# Patient Record
Sex: Female | Born: 1995 | Race: White | Hispanic: No | Marital: Single | Smoking: Never smoker
Health system: Southern US, Community
[De-identification: ages and names within clinical notes are randomized; demographics above are authoritative.]

## PROBLEM LIST (undated history)

## (undated) ENCOUNTER — Inpatient Hospital Stay (HOSPITAL_COMMUNITY): Payer: Self-pay

## (undated) DIAGNOSIS — R569 Unspecified convulsions: Secondary | ICD-10-CM

## (undated) DIAGNOSIS — D649 Anemia, unspecified: Secondary | ICD-10-CM

## (undated) HISTORY — PX: NO PAST SURGERIES: SHX2092

---

## 2017-11-03 LAB — OB RESULTS CONSOLE HGB/HCT, BLOOD
HEMATOCRIT: 38
Hemoglobin: 12.5

## 2017-11-03 LAB — OB RESULTS CONSOLE ABO/RH: RH TYPE: POSITIVE

## 2017-11-03 LAB — OB RESULTS CONSOLE RPR: RPR: NONREACTIVE

## 2017-11-03 LAB — OB RESULTS CONSOLE GC/CHLAMYDIA
Chlamydia: NEGATIVE
GC PROBE AMP, GENITAL: NEGATIVE

## 2017-11-03 LAB — OB RESULTS CONSOLE PLATELET COUNT: PLATELETS: 270

## 2017-11-03 LAB — OB RESULTS CONSOLE RUBELLA ANTIBODY, IGM: RUBELLA: IMMUNE

## 2017-11-03 LAB — OB RESULTS CONSOLE ANTIBODY SCREEN: ANTIBODY SCREEN: NEGATIVE

## 2017-11-03 LAB — OB RESULTS CONSOLE HIV ANTIBODY (ROUTINE TESTING): HIV: NONREACTIVE

## 2017-11-03 LAB — OB RESULTS CONSOLE HEPATITIS B SURFACE ANTIGEN: Hepatitis B Surface Ag: NEGATIVE

## 2017-11-12 ENCOUNTER — Other Ambulatory Visit (HOSPITAL_COMMUNITY): Payer: Self-pay | Admitting: Specialist

## 2017-11-12 DIAGNOSIS — O0930 Supervision of pregnancy with insufficient antenatal care, unspecified trimester: Secondary | ICD-10-CM

## 2017-11-12 DIAGNOSIS — Z3A23 23 weeks gestation of pregnancy: Secondary | ICD-10-CM

## 2017-11-12 DIAGNOSIS — Z3689 Encounter for other specified antenatal screening: Secondary | ICD-10-CM

## 2017-11-21 ENCOUNTER — Encounter (HOSPITAL_COMMUNITY): Payer: Self-pay | Admitting: Specialist

## 2017-11-26 ENCOUNTER — Encounter (HOSPITAL_COMMUNITY): Payer: Self-pay | Admitting: *Deleted

## 2017-11-27 ENCOUNTER — Ambulatory Visit (HOSPITAL_COMMUNITY): Admission: RE | Admit: 2017-11-27 | Payer: Medicaid Other | Source: Ambulatory Visit

## 2017-11-27 ENCOUNTER — Other Ambulatory Visit: Payer: Self-pay

## 2017-11-27 ENCOUNTER — Observation Stay (HOSPITAL_COMMUNITY)
Admission: AD | Admit: 2017-11-27 | Discharge: 2017-11-28 | Disposition: A | Discharge status: Home / Self Care | Payer: Medicaid Other | Source: Ambulatory Visit | Attending: Obstetrics & Gynecology | Admitting: Obstetrics & Gynecology

## 2017-11-27 ENCOUNTER — Ambulatory Visit (HOSPITAL_COMMUNITY)
Admission: RE | Admit: 2017-11-27 | Discharge: 2017-11-27 | Disposition: A | Discharge status: Home / Self Care | Payer: Medicaid Other | Source: Ambulatory Visit | Attending: Specialist | Admitting: Specialist

## 2017-11-27 ENCOUNTER — Encounter (HOSPITAL_COMMUNITY): Payer: Self-pay | Admitting: *Deleted

## 2017-11-27 ENCOUNTER — Encounter (HOSPITAL_COMMUNITY): Payer: Self-pay

## 2017-11-27 DIAGNOSIS — O0992 Supervision of high risk pregnancy, unspecified, second trimester: Secondary | ICD-10-CM | POA: Insufficient documentation

## 2017-11-27 DIAGNOSIS — E669 Obesity, unspecified: Secondary | ICD-10-CM | POA: Insufficient documentation

## 2017-11-27 DIAGNOSIS — O4100X0 Oligohydramnios, unspecified trimester, not applicable or unspecified: Secondary | ICD-10-CM

## 2017-11-27 DIAGNOSIS — Z3A23 23 weeks gestation of pregnancy: Secondary | ICD-10-CM | POA: Insufficient documentation

## 2017-11-27 DIAGNOSIS — O99210 Obesity complicating pregnancy, unspecified trimester: Secondary | ICD-10-CM | POA: Diagnosis present

## 2017-11-27 DIAGNOSIS — Z3687 Encounter for antenatal screening for uncertain dates: Secondary | ICD-10-CM | POA: Insufficient documentation

## 2017-11-27 DIAGNOSIS — O321XX0 Maternal care for breech presentation, not applicable or unspecified: Secondary | ICD-10-CM | POA: Diagnosis present

## 2017-11-27 DIAGNOSIS — O99352 Diseases of the nervous system complicating pregnancy, second trimester: Secondary | ICD-10-CM | POA: Insufficient documentation

## 2017-11-27 DIAGNOSIS — O4103X0 Oligohydramnios, third trimester, not applicable or unspecified: Secondary | ICD-10-CM | POA: Diagnosis not present

## 2017-11-27 DIAGNOSIS — O99212 Obesity complicating pregnancy, second trimester: Secondary | ICD-10-CM

## 2017-11-27 DIAGNOSIS — O4102X1 Oligohydramnios, second trimester, fetus 1: Principal | ICD-10-CM | POA: Insufficient documentation

## 2017-11-27 DIAGNOSIS — O0930 Supervision of pregnancy with insufficient antenatal care, unspecified trimester: Secondary | ICD-10-CM

## 2017-11-27 DIAGNOSIS — O4102X0 Oligohydramnios, second trimester, not applicable or unspecified: Secondary | ICD-10-CM | POA: Insufficient documentation

## 2017-11-27 DIAGNOSIS — O0990 Supervision of high risk pregnancy, unspecified, unspecified trimester: Secondary | ICD-10-CM

## 2017-11-27 DIAGNOSIS — O209 Hemorrhage in early pregnancy, unspecified: Secondary | ICD-10-CM | POA: Diagnosis not present

## 2017-11-27 DIAGNOSIS — Z3689 Encounter for other specified antenatal screening: Secondary | ICD-10-CM | POA: Insufficient documentation

## 2017-11-27 DIAGNOSIS — Z3A24 24 weeks gestation of pregnancy: Secondary | ICD-10-CM | POA: Insufficient documentation

## 2017-11-27 DIAGNOSIS — O321XX1 Maternal care for breech presentation, fetus 1: Secondary | ICD-10-CM | POA: Insufficient documentation

## 2017-11-27 DIAGNOSIS — O0932 Supervision of pregnancy with insufficient antenatal care, second trimester: Secondary | ICD-10-CM

## 2017-11-27 DIAGNOSIS — Z79899 Other long term (current) drug therapy: Secondary | ICD-10-CM | POA: Insufficient documentation

## 2017-11-27 DIAGNOSIS — G40909 Epilepsy, unspecified, not intractable, without status epilepticus: Secondary | ICD-10-CM | POA: Insufficient documentation

## 2017-11-27 DIAGNOSIS — O289 Unspecified abnormal findings on antenatal screening of mother: Secondary | ICD-10-CM | POA: Diagnosis not present

## 2017-11-27 DIAGNOSIS — O280 Abnormal hematological finding on antenatal screening of mother: Secondary | ICD-10-CM | POA: Diagnosis present

## 2017-11-27 HISTORY — DX: Unspecified convulsions: R56.9

## 2017-11-27 HISTORY — DX: Anemia, unspecified: D64.9

## 2017-11-27 LAB — CBC
HCT: 38.8 % (ref 36.0–46.0)
Hemoglobin: 13 g/dL (ref 12.0–15.0)
MCH: 28.2 pg (ref 26.0–34.0)
MCHC: 33.5 g/dL (ref 30.0–36.0)
MCV: 84.2 fL (ref 78.0–100.0)
PLATELETS: 235 10*3/uL (ref 150–400)
RBC: 4.61 MIL/uL (ref 3.87–5.11)
RDW: 16.1 % — AB (ref 11.5–15.5)
WBC: 11.8 10*3/uL — AB (ref 4.0–10.5)

## 2017-11-27 LAB — COMPREHENSIVE METABOLIC PANEL
ALK PHOS: 61 U/L (ref 38–126)
ALT: 15 U/L (ref 14–54)
AST: 18 U/L (ref 15–41)
Albumin: 3.2 g/dL — ABNORMAL LOW (ref 3.5–5.0)
Anion gap: 8 (ref 5–15)
BILIRUBIN TOTAL: 0.3 mg/dL (ref 0.3–1.2)
BUN: 11 mg/dL (ref 6–20)
CALCIUM: 8.6 mg/dL — AB (ref 8.9–10.3)
CO2: 21 mmol/L — ABNORMAL LOW (ref 22–32)
CREATININE: 0.57 mg/dL (ref 0.44–1.00)
Chloride: 105 mmol/L (ref 101–111)
GFR calc Af Amer: >60 mL/min (ref >60)
Glucose, Bld: 81 mg/dL (ref 65–99)
POTASSIUM: 4 mmol/L (ref 3.5–5.1)
Sodium: 134 mmol/L — ABNORMAL LOW (ref 135–145)
TOTAL PROTEIN: 6.7 g/dL (ref 6.5–8.1)

## 2017-11-27 LAB — URINALYSIS, ROUTINE W REFLEX MICROSCOPIC
BILIRUBIN URINE: NEGATIVE
Bilirubin Urine: NEGATIVE
GLUCOSE, UA: NEGATIVE mg/dL
GLUCOSE, UA: NEGATIVE mg/dL
HGB URINE DIPSTICK: NEGATIVE
KETONES UR: NEGATIVE mg/dL
Ketones, ur: NEGATIVE mg/dL
LEUKOCYTES UA: NEGATIVE
NITRITE: NEGATIVE
Nitrite: NEGATIVE
PH: 8 (ref 5.0–8.0)
PH: 8 (ref 5.0–8.0)
PROTEIN: NEGATIVE mg/dL
Protein, ur: NEGATIVE mg/dL
SPECIFIC GRAVITY, URINE: 1.011 (ref 1.005–1.030)
SPECIFIC GRAVITY, URINE: 1.012 (ref 1.005–1.030)

## 2017-11-27 LAB — ABO/RH: ABO/RH(D): A POS

## 2017-11-27 LAB — RAPID URINE DRUG SCREEN, HOSP PERFORMED
Amphetamines: NOT DETECTED
Barbiturates: NOT DETECTED
Benzodiazepines: NOT DETECTED
Cocaine: NOT DETECTED
Opiates: NOT DETECTED
TETRAHYDROCANNABINOL: POSITIVE — AB

## 2017-11-27 LAB — PROTEIN / CREATININE RATIO, URINE
CREATININE, URINE: 70 mg/dL
PROTEIN CREATININE RATIO: 0.1 mg/mg{creat} (ref 0.00–0.15)
Total Protein, Urine: 7 mg/dL

## 2017-11-27 LAB — TYPE AND SCREEN
ABO/RH(D): A POS
Antibody Screen: NEGATIVE

## 2017-11-27 LAB — AMNISURE RUPTURE OF MEMBRANE (ROM) NOT AT ARMC: Amnisure ROM: NEGATIVE

## 2017-11-27 MED ORDER — PRENATAL MULTIVITAMIN CH
1.0000 | ORAL_TABLET | Freq: Every day | ORAL | Status: DC
  Administered 2017-11-27: 1 via ORAL
  Filled 2017-11-27: qty 1

## 2017-11-27 MED ORDER — ACETAMINOPHEN 325 MG PO TABS: 650.0000 mg | ORAL_TABLET | ORAL | Status: DC | PRN

## 2017-11-27 MED ORDER — DOCUSATE SODIUM 100 MG PO CAPS
100.0000 mg | ORAL_CAPSULE | Freq: Every day | ORAL | Status: DC
  Administered 2017-11-27: 100 mg via ORAL
  Filled 2017-11-27: qty 1

## 2017-11-27 MED ORDER — CALCIUM CARBONATE ANTACID 500 MG PO CHEW: 2.0000 | CHEWABLE_TABLET | ORAL | Status: DC | PRN

## 2017-11-27 MED ORDER — BETAMETHASONE SOD PHOS & ACET 6 (3-3) MG/ML IJ SUSP
12.0000 mg | INTRAMUSCULAR | Status: AC
  Administered 2017-11-27 – 2017-11-28 (×2): 12 mg via INTRAMUSCULAR
  Filled 2017-11-27 (×3): qty 2

## 2017-11-27 MED ORDER — ZOLPIDEM TARTRATE 5 MG PO TABS: 5.0000 mg | ORAL_TABLET | Freq: Every evening | ORAL | Status: DC | PRN

## 2017-11-27 NOTE — Consult Note (Signed)
Neonatology Consult Note:  At the request of the patients obstetrician Dr. Roselie Awkward I met with Annette Bridges who is a 22 y.o. G1P0,at [redacted]w[redacted]d  FOB, PGM and aunt were also present for the consult.   Pregnancy complicated by oligohydramnios, an abnormal Quad screen with 1:5 risk of ONTD. Both MSAFP and Inhibin are significantly elevated On ultrasound evaluation there is no evidence of ONTD and therefore the elevation in serum analytes is likely due to poor placentation rather than an ONTD.  One kidney was clearly visualized suggesting that renal agenesis is not the cause of the oligohydramnios and  Ms. BChisumdoes not endorse a history of fluid loss.   She will be evaluated for PPROM and if positive will start on latency antibiotics. She is receiving betamethasone injections and will observed on the antenatal unit.  We discussed morbidity/mortality at this gestional age, delivery room resuscitation, including intubation and surfactant in DR.  Discussed mechanical ventilation and risk for chronic lung disease, risk for IVH with potential for motor / cognitive deficits, ROP, NEC, sepsis, as well as temperature instability and feeding immaturity.  Discussed NG / OG feeds, benefits of MBM in reducing incidence of NEC.   Discussed likely length of stay.  Both [name] and FOB requested full resuscitative efforts for their infant.  Thank you for allowing [facility] participate in her care.  Please call with questions.  BHiginio Roger DO  Neonatologist  The total length of face-to-face or floor / unit time for this encounter was 30 minutes.  Counseling and / or coordination of care was greater than fifty percent of the time.

## 2017-11-27 NOTE — ED Notes (Signed)
Report called to Casimiro NeedlePam Lawson, RN.  Pt ambulated with Mother to room 311 per [facility]Nitsche[facility]

## 2017-11-27 NOTE — Consult Note (Signed)
MATERNAL FETAL MEDICINE CONSULT  Patient Name: Annette Bridges Medical Record Number:  [id number] Date of Birth: Feb 11, 1996 Requesting Physician Name:  Alm Bustard, MD Date of Service: 11/27/2017  Chief Complaint Elevated ONTD risk  History of Present Illness Annette Bridges  is a 22 y.o. G1P0,at [redacted]w[redacted]d with an EDD of 03/20/2018 who is seen at the request of Dr. Shawnie Pons for 1:5 risk of ONTD on her second trimester screen.  Both MSAFP and Inhibin are significantly elevated.  She reports some mild vaginal bleeding after intercourse 2 days ago, but it has resolved.  She denies loss of fluid or contractions.  She has a seizure for which she takes lamotrigine.  She is otherwise healthy.  Review of Systems Pertinent items are noted in HPI.  Patient History OB History  Gravida Para Term Preterm AB Living  1            SAB TAB Ectopic Multiple Live Births               # Outcome Date GA Lbr Len/2nd Weight Sex Delivery Anes PTL Lv  1 Current             Past Medical History:  Diagnosis Date  . Anemia   . Seizures (HCC)     Past Surgical History:  Procedure Laterality Date  . NO PAST SURGERIES      Social History   Socioeconomic History  . Marital status: Single    Spouse name: Not on file  . Number of children: Not on file  . Years of education: Not on file  . Highest education level: Not on file  Occupational History  . Not on file  Social Needs  . Financial resource strain: Not on file  . Food insecurity:    Worry: Not on file    Inability: Not on file  . Transportation needs:    Medical: Not on file    Non-medical: Not on file  Tobacco Use  . Smoking status: Never Smoker  . Smokeless tobacco: Never Used  Substance and Sexual Activity  . Alcohol use: Never    Frequency: Never  . Drug use: Yes    Types: Marijuana  . Sexual activity: Not on file  Lifestyle  . Physical activity:    Days per week: Not on file    Minutes per session: Not on file  . Stress: Not on file   Relationships  . Social connections:    Talks on phone: Not on file    Gets together: Not on file    Attends religious service: Not on file    Active member of club or organization: Not on file    Attends meetings of clubs or organizations: Not on file    Relationship status: Not on file  Other Topics Concern  . Not on file  Social History Narrative  . Not on file    Physical Examination Vitals - BP 146/92, Pulse 80, Weight 222 lbs. General appearance - alert, well appearing, and in no distress Mental status - alert, oriented to person, place, and time Abdomen - soft, nontender, nondistended, no masses or organomegaly Extremities - no pedal edema noted  Assessment and Recommendations 1.  Elevated MSAFP and Inhibin.  Severe oligohydramnios was discovered on today's ultrasound.  Although the visualization of the anatomy is limit there is no evidence of ONTD either in the spine or intracranially.  Thus, I am concerned that the elevation in serum analytes is due to poor placentation  rather than an ONTD.  One kidney was clearly visualized today suggesting that renal agenesis is not the cause of the oligohydramnios.  In addition, Ms. Athena MasseBonney reports her fluid level was normal on her last ultrasound which was performed on 11/03/17.  Given all these points I think the primary problem is placental insufficiency which would explain both the oligohydramnios and elevated MSAFP and Inhibin.  Severe oligohydramnios at this early point in pregnancy greatly increases Ms. Shuler's risk of premature birth and stillbirth, although there does not appear to be any acute fetal compromise at this time.  As Ms. Athena MasseBonney is past viability (I.e. EFW 540 g, GA 625w6d) I recommend admission for betamethasone, NICU consult, and extended fetal monitoring.  Ms. Athena MasseBonney agrees.  I have discussed her case with Dr. Debroah LoopArnold with the faculty practice, and she will go directly to antepartum from here.  I spent 30 minutes with Ms.  Athena MasseBonney today of which 50% was face-to-face counseling.  [facility].  Please do not hesitate to contact us with questions.   Rema FendtNITSCHE,Ramiel Forti, MD

## 2017-11-27 NOTE — H&P (Signed)
Faculty Practice Antenatal History and Physical  Elysia Grand WJX:[id number] DOB: 1996/01/28 DOA: 11/27/2017  Chief Complaint: low amniotic fluid  HPI: Annette Bridges is a 22 y.o. female G1P0 with IUP at [redacted]w[redacted]d presenting for observation after US showed oligohydramnios in MFM today She received prenatal care with Dr. Shawnie Pons in [facility]. Quad screen was abnormal.  Review of Systems:   Pt complains of recent episode of vaginal bleeding with no bleeding today and no discharge.  Pt denies any vaginal discharge or contractions. Good fetal movement.  Review of systems are otherwise negative  Prenatal History/Complications: Abnormal Quad screen normal anatomy Positive screen for marijuana  Past Medical History: Past Medical History:  Diagnosis Date  . Anemia   . Seizures (HCC)     Past Surgical History: Past Surgical History:  Procedure Laterality Date  . NO PAST SURGERIES      Obstetrical History: OB History    Gravida  1   Para      Term      Preterm      AB      Living        SAB      TAB      Ectopic      Multiple      Live Births              Gynecological History: Pertinent Gynecological History: Menses: pregnancy  Last pap: no result available    Social History: Social History   Socioeconomic History  . Marital status: Single    Spouse name: Not on file  . Number of children: Not on file  . Years of education: Not on file  . Highest education level: Not on file  Occupational History  . Not on file  Social Needs  . Financial resource strain: Not on file  . Food insecurity:    Worry: Not on file    Inability: Not on file  . Transportation needs:    Medical: Not on file    Non-medical: Not on file  Tobacco Use  . Smoking status: Never Smoker  . Smokeless tobacco: Never Used  Substance and Sexual Activity  . Alcohol use: Never    Frequency: Never  . Drug use: Yes    Types: Marijuana  . Sexual activity: Yes    Birth  control/protection: None  Lifestyle  . Physical activity:    Days per week: Not on file    Minutes per session: Not on file  . Stress: Not on file  Relationships  . Social connections:    Talks on phone: Not on file    Gets together: Not on file    Attends religious service: Not on file    Active member of club or organization: Not on file    Attends meetings of clubs or organizations: Not on file    Relationship status: Not on file  Other Topics Concern  . Not on file  Social History Narrative  . Not on file    Family History: Family History  Problem Relation Age of Onset  . Diabetes Mother   . Diabetes Maternal Aunt   . Diabetes Maternal Grandmother   . Diabetes Maternal Grandfather     Allergies: No Known Allergies  Medications Prior to Admission  Medication Sig Dispense Refill Last Dose  . IRON PO Take by mouth.   Taking  . Prenatal Multivit-Min-Fe-FA (PRENATAL VITAMINS PO) Take by mouth.   Taking     Physical Exam: Ht 5'  6" (1.676 m)   Wt 100.8 kg (222 lb 3 oz)   BMI 35.86 kg/m    General appearance: alert, cooperative and no distress Neck: supple, symmetrical, trachea midline Lungs: normal respiratory effort Heart: RRR Abdomen: gravid not tender Extremities: extremities normal, atraumatic, no cyanosis or edema breech Baseline: 150 bpm, Variability: Fair (1-6 bpm), Accelerations: Non-reactive but appropriate for gestational age and Decelerations: Absent None             Labs on Admission:  Basic Metabolic Panel: No results for input(s): NA, K, CL, CO2, GLUCOSE, BUN, CREATININE, CALCIUM, MG, PHOS in the last 168 hours. Liver Function Tests: No results for input(s): AST, ALT, ALKPHOS, BILITOT, PROT, ALBUMIN in the last 168 hours. No results for input(s): LIPASE, AMYLASE in the last 168 hours. No results for input(s): AMMONIA in the last 168 hours. CBC: No results for input(s): WBC, NEUTROABS, HGB, HCT, MCV, PLT in the last 168 hours.  CBG: No  results for input(s): GLUCAP in the last 168 hours.  Radiological Exams on Admission: [place] Mfm Ob Detail +14 Wk  Result Date: 11/27/2017 ----------------------------------------------------------------------  OBSTETRICS REPORT                      (Signed Final 11/27/2017 10:10 am) ---------------------------------------------------------------------- Patient Info  ID #:       [id number]                          D.O.B.:  Oct 26, 1995 (21 yrs)  Name:       Annette Bridges                   Visit Date: 11/27/2017 09:27 am ---------------------------------------------------------------------- Performed By  Performed By:     Marcellina Millin          Ref. Address:     [address]                    [facility]                                                             [place], [place]                                                             [address]  Attending:        Rema Fendt MD      Location:         [facility]  Referred By:      Alm Bustard                    MD ---------------------------------------------------------------------- Orders   #  Description                                 Code   1  [place] MFM OB DETAIL +14 WK  [id number]  ----------------------------------------------------------------------   #  Ordered By               Order #        Accession #    Episode #   1  Arther AbbottHENRY DORN               [id number]      [id number]     [id number]  ---------------------------------------------------------------------- Indications   [redacted] weeks gestation of pregnancy                Z3A.23   Encounter for uncertain dates                  Z36.87   Encounter for fetal anatomic survey            Z36.89   Abnormal biochemical screen (Abnormal          O28.9   quad, OSB 1:5)   Late prenatal care, second trimester           O09.32   Obesity complicating pregnancy, second         O99.212   trimester   Seizure disorder                               O99.350 G40.909   ---------------------------------------------------------------------- OB History  Gravidity:    1         Term:   0        Prem:   0        SAB:   0  TOP:          0       Ectopic:  0        Living: 0 ---------------------------------------------------------------------- Fetal Evaluation  Num Of Fetuses:     1  Fetal Heart         141  Rate(bpm):  Cardiac Activity:   Observed  Presentation:       Breech  Placenta:           Anterior, above cervical os  P. Cord Insertion:  Not well visualized  Amniotic Fluid  AFI FV:      Oligohydramnios  AFI Sum(cm)     %Tile  .95             < 3 ---------------------------------------------------------------------- Biometry  BPD:      54.7  mm     G. Age:  22w 5d          9  %    CI:         69.7   %    70 - 86                                                          FL/HC:      17.9   %    18.7 - 20.9  HC:      209.1  mm     G. Age:  23w 0d          9  %    HC/AC:      1.11        1.05 - 1.21  AC:      187.8  mm  G. Age:  23w 4d         32  %    FL/BPD:     68.4   %    71 - 87  FL:       37.4  mm     G. Age:  22w 0d        < 3  %    FL/AC:      19.9   %    20 - 24  HUM:      37.4  mm     G. Age:  23w 1d         25  %  CER:      30.1  mm     G. Age:  26w 4d       > 95  %  Est. FW:     540  gm      1 lb 3 oz     30  % ---------------------------------------------------------------------- Gestational Age  LMP:           24w 5d        Date:  06/07/17                 EDD:   03/14/18  U/S Today:     22w 6d                                        EDD:   03/27/18  Best:          23w 6d     Det. ByMarcella Dubs         EDD:   03/20/18                                      (11/03/17) ---------------------------------------------------------------------- Anatomy  Cranium:               Appears normal         Aortic Arch:            Not well visualized  Cavum:                 Appears normal         Ductal Arch:            Not well visualized  Ventricles:            Appears normal          Diaphragm:              Appears normal  Choroid Plexus:        Appears normal         Stomach:                Appears normal, left                                                                        sided  Cerebellum:            Appears normal  Abdomen:                Appears normal  Posterior Fossa:       Not well visualized    Abdominal Wall:         Not well visualized  Nuchal Fold:           Not applicable (>20    Cord Vessels:           Not well visualized                         wks GA)  Face:                  Not well visualized    Kidneys:                Not well visualized  Lips:                  Appears normal         Bladder:                Appears normal  Thoracic:              Appears normal         Spine:                  Not well visualized  Heart:                 Not well visualized    Upper Extremities:      Not well visualized  RVOT:                  Appears normal         Lower Extremities:      Visualized  LVOT:                  Appears normal  Other:  Fetus appears to be a female. Technically difficult due to low amniotic          fluid. ---------------------------------------------------------------------- Cervix Uterus Adnexa  Cervix  Length:            3.5  cm.  Normal appearance by transabdominal scan. ---------------------------------------------------------------------- Comments  Severe oligohydramnios is seen on today's exam.  One  kidney is clearly present, but both could not be clearly  visualized.  As Ms. Meland does not report and leakage of  fluid, it is more likely that the low fluid is due to placental  insufficiency that PPROM.  Placental insufficiency would also  be consisted with her elevated Inhibin and MSAFP on her  second trimester screen. ---------------------------------------------------------------------- Impression  Single living intrauterine pregnancy at 23 weeks 6 days.  Appropriate fetal growth (30%).  Severe oligohydramnios.  The fetal anatomic survey is  not complete.  No gross fetal anomalies identified. ---------------------------------------------------------------------- Recommendations  Recommend follow-up ultrasound examination in one week to  reassess fluid volume and fetal anatomy. ----------------------------------------------------------------------                 Rema Fendt, MD Electronically Signed Final Report   11/27/2017 10:10 am ----------------------------------------------------------------------    Assessment/Plan Oligohydramnios [redacted]w[redacted]d Evaluate for possible PROM, if positive give latency antibiotics Betamethasone injections  Hospitalization on antenatal unit   Code Status: Full    Time spent: 40 min face to face and coordination of care  Adam Phenix, MD 11/27/2017 11:05 AM Faculty Practice Attending Physician [facility] Attending Phone #: [phone]  **Disclaimer: This note may have been dictated with voice recognition software. Similar sounding words can inadvertently be transcribed and this note may contain transcription errors which may not have been corrected upon publication of note.**

## 2017-11-28 DIAGNOSIS — O4102X1 Oligohydramnios, second trimester, fetus 1: Secondary | ICD-10-CM | POA: Diagnosis not present

## 2017-11-28 LAB — GC/CHLAMYDIA PROBE AMP (~~LOC~~) NOT AT ARMC
Chlamydia: NEGATIVE
Neisseria Gonorrhea: NEGATIVE

## 2017-11-28 NOTE — Progress Notes (Signed)
Pt discharged with printed instructions. Pt verbalized an understanding. No concerns noted. Varina Hulon L Tamiko Leopard, RN 

## 2017-11-28 NOTE — Discharge Summary (Signed)
Patient ID: Annette Bridges MRN: [id number] DOB/AGE: 24/12/1995 21 y.o.  Admit date: 11/27/2017 Discharge date: 11/28/2017  Admission Diagnoses:2635w0d Patient Active Problem List   Diagnosis Date Noted  . Oligohydramnios antepartum 11/27/2017  . Obesity affecting pregnancy, antepartum 11/27/2017  . Supervision of high risk pregnancy, antepartum 11/27/2017  . Breech presentation with antenatal problem 11/27/2017  . Abnormal quad screen 11/27/2017     Discharge Diagnoses: same  Prenatal Procedures: ultrasound and Amnisure  Consults: none  Hospital Course:  This is a 22 y.o. G1P0 with IUP at 2635w0d admitted for oligohydramnios with abnormal Quad screen, [place] done on 3/18 in [facility]. She reported vaginal bleeding 2 days before admission .  No leaking of fluid and no bleeding.  She received betamethasone x 2 doses.   Amnisure was negative.She was deemed stable for discharge to home with outpatient follow up.  Discharge Exam: Temp:  [98.2 F (36.8 C)-98.7 F (37.1 C)] 98.2 F (36.8 C) (03/29 0800) Pulse Rate:  [55-66] 55 (03/29 0800) Resp:  [16-18] 16 (03/29 0800) BP: (100-134)/(72-77) 121/75 (03/29 0800) SpO2:  [99 %-100 %] 100 % (03/29 0800) Physical Examination: CONSTITUTIONAL: Well-developed, well-nourished female in no acute distress.  HENT:  Normocephalic, atraumatic, External right and left ear normal. Oropharynx is clear and moist EYES: Conjunctivae and EOM are normal. Pupils are equal, round, and reactive to light. No scleral icterus.  NECK: Normal range of motion, supple, no masses SKIN: Skin is warm and dry. No rash noted. Not diaphoretic. No erythema. No pallor. NEUROLGIC: Alert and oriented to person, place, and time. Normal reflexes, muscle tone coordination. No cranial nerve deficit noted. PSYCHIATRIC: Normal mood and affect. Normal behavior. Normal judgment and thought content. CARDIOVASCULAR: Normal heart rate noted, regular rhythm RESPIRATORY: Effort and breath sounds  normal, no problems with respiration noted MUSCULOSKELETAL: Normal range of motion. No edema and no tenderness. 2+ distal pulses. ABDOMEN: Soft, nontender, nondistended, gravid. CERVIX:    Fetal monitoring:  Fetal Heart Rate A  Mode External filed at 11/28/2017 0830  Baseline Rate (A) 140 bpm filed at 11/28/2017 0830  Variability 6-25 BPM, <5 BPM filed at 11/28/2017 0830  Accelerations 10 x 10 filed at 11/28/2017 0830  Decelerations None filed at 11/28/2017 0830   tracing reviewed   Significant Diagnostic Studies:  ----------------------------------------------------------------------  OBSTETRICS REPORT                      (Signed Final 11/27/2017 10:10 am) ---------------------------------------------------------------------- Patient Info  ID #:       [id number]                          D.O.B.:  09/28/1995 (21 yrs)  Name:       Annette EvenerKARLYE Eifler                   Visit Date: 11/27/2017 09:27 am ---------------------------------------------------------------------- Performed By  Performed By:     Marcellina MillinKelly L Moser          Ref. Address:     [address]                    [place]  [place], [place]                                                             [address]  Attending:        Rema Fendt MD      Location:         [facility]  Referred By:      Alm Bustard                    MD ---------------------------------------------------------------------- Orders   #  Description                                 Code   1  [place] MFM OB DETAIL +14 WK                     76811.01  ----------------------------------------------------------------------   #  Ordered By               Order #        Accession #    Episode #   1  Arther Abbott               [id number]      [id number]     [id number]  ---------------------------------------------------------------------- Indications   [redacted] weeks gestation of pregnancy                Z3A.23    Encounter for uncertain dates                  Z36.87   Encounter for fetal anatomic survey            Z36.89   Abnormal biochemical screen (Abnormal          O28.9   quad, OSB 1:5)   Late prenatal care, second trimester           O09.32   Obesity complicating pregnancy, second         O99.212   trimester   Seizure disorder                               O99.350 G40.909  ---------------------------------------------------------------------- OB History  Gravidity:    1         Term:   0        Prem:   0        SAB:   0  TOP:          0       Ectopic:  0        Living: 0 ---------------------------------------------------------------------- Fetal Evaluation  Num Of Fetuses:     1  Fetal Heart         141  Rate(bpm):  Cardiac Activity:   Observed  Presentation:       Breech  Placenta:           Anterior, above cervical os  P. Cord Insertion:  Not well visualized  Amniotic Fluid  AFI FV:      Oligohydramnios  AFI Sum(cm)     %Tile  .95             <  3 ---------------------------------------------------------------------- Biometry  BPD:      54.7  mm     G. Age:  22w 5d          9  %    CI:         69.7   %    70 - 86                                                          FL/HC:      17.9   %    18.7 - 20.9  HC:      209.1  mm     G. Age:  23w 0d          9  %    HC/AC:      1.11        1.05 - 1.21  AC:      187.8  mm     G. Age:  23w 4d         32  %    FL/BPD:     68.4   %    71 - 87  FL:       37.4  mm     G. Age:  22w 0d        < 3  %    FL/AC:      19.9   %    20 - 24  HUM:      37.4  mm     G. Age:  23w 1d         25  %  CER:      30.1  mm     G. Age:  26w 4d       > 95  %  Est. FW:     540  gm      1 lb 3 oz     30  % ---------------------------------------------------------------------- Gestational Age  LMP:           24w 5d        Date:  06/07/17                 EDD:   03/14/18  U/S Today:     22w 6d                                        EDD:   03/27/18  Best:           23w 6d     Det. By:  Marcella Dubs         EDD:   03/20/18                                      (11/03/17) ---------------------------------------------------------------------- Anatomy  Cranium:               Appears normal         Aortic Arch:            Not well visualized  Cavum:                 Appears normal  Ductal Arch:            Not well visualized  Ventricles:            Appears normal         Diaphragm:              Appears normal  Choroid Plexus:        Appears normal         Stomach:                Appears normal, left                                                                        sided  Cerebellum:            Appears normal         Abdomen:                Appears normal  Posterior Fossa:       Not well visualized    Abdominal Wall:         Not well visualized  Nuchal Fold:           Not applicable (>20    Cord Vessels:           Not well visualized                         wks GA)  Face:                  Not well visualized    Kidneys:                Not well visualized  Lips:                  Appears normal         Bladder:                Appears normal  Thoracic:              Appears normal         Spine:                  Not well visualized  Heart:                 Not well visualized    Upper Extremities:      Not well visualized  RVOT:                  Appears normal         Lower Extremities:      Visualized  LVOT:                  Appears normal  Other:  Fetus appears to be a female. Technically difficult due to low amniotic          fluid. ---------------------------------------------------------------------- Cervix Uterus Adnexa  Cervix  Length:            3.5  cm.  Normal appearance by transabdominal scan. ---------------------------------------------------------------------- Comments  Severe oligohydramnios is seen on today's exam.  One  kidney is clearly present, but both could not be clearly  visualized.  As Ms. Mcinroy does not report and  leakage of  fluid, it is more likely that the low fluid is due to placental  insufficiency that PPROM.  Placental insufficiency would also  be consisted with her elevated Inhibin and MSAFP on her  second trimester screen. ---------------------------------------------------------------------- Impression  Single living intrauterine pregnancy at 23 weeks 6 days.  Appropriate fetal growth (30%).  Severe oligohydramnios.  The fetal anatomic survey is not complete.  No gross fetal anomalies identified. ---------------------------------------------------------------------- Recommendations  Recommend follow-up ultrasound examination in one week to  reassess fluid volume and fetal anatomy. ----------------------------------------------------------------------                 Rema Fendt, MD Electronically Signed Final Report   11/27/2017 10:10 am ----------------------------------------------------------------------  Results for orders placed or performed during the hospital encounter of 11/27/17 (from the past 168 hour(s))  Amnisure rupture of membrane (rom)not at [facility]   Collection Time: 11/27/17 10:56 AM  Result Value Ref Range   Amnisure ROM NEGATIVE   Urine rapid drug screen (hosp performed)   Collection Time: 11/27/17 11:01 AM  Result Value Ref Range   Opiates NONE DETECTED NONE DETECTED   Cocaine NONE DETECTED NONE DETECTED   Benzodiazepines NONE DETECTED NONE DETECTED   Amphetamines NONE DETECTED NONE DETECTED   Tetrahydrocannabinol POSITIVE (A) NONE DETECTED   Barbiturates NONE DETECTED NONE DETECTED  Urinalysis, Routine w reflex microscopic   Collection Time: 11/27/17 11:49 AM  Result Value Ref Range   Color, Urine YELLOW YELLOW   APPearance HAZY (A) CLEAR   Specific Gravity, Urine 1.011 1.005 - 1.030   pH 8.0 5.0 - 8.0   Glucose, UA NEGATIVE NEGATIVE mg/dL   Hgb urine dipstick MODERATE (A) NEGATIVE   Bilirubin Urine NEGATIVE NEGATIVE   Ketones, ur NEGATIVE NEGATIVE  mg/dL   Protein, ur NEGATIVE NEGATIVE mg/dL   Nitrite NEGATIVE NEGATIVE   Leukocytes, UA MODERATE (A) NEGATIVE   RBC / HPF 0-5 0 - 5 RBC/hpf   WBC, UA 0-5 0 - 5 WBC/hpf   Bacteria, UA FEW (A) NONE SEEN   Squamous Epithelial / LPF 6-30 (A) NONE SEEN  Protein / creatinine ratio, urine   Collection Time: 11/27/17 11:49 AM  Result Value Ref Range   Creatinine, Urine 70.00 mg/dL   Total Protein, Urine 7 mg/dL   Protein Creatinine Ratio 0.10 0.00 - 0.15 mg/mg[Cre]  Comprehensive metabolic panel   Collection Time: 11/27/17 12:00 PM  Result Value Ref Range   Sodium 134 (L) 135 - 145 mmol/L   Potassium 4.0 3.5 - 5.1 mmol/L   Chloride 105 101 - 111 mmol/L   CO2 21 (L) 22 - 32 mmol/L   Glucose, Bld 81 65 - 99 mg/dL   BUN 11 6 - 20 mg/dL   Creatinine, Ser 1.61 0.44 - 1.00 mg/dL   Calcium 8.6 (L) 8.9 - 10.3 mg/dL   Total Protein 6.7 6.5 - 8.1 g/dL   Albumin 3.2 (L) 3.5 - 5.0 g/dL   AST 18 15 - 41 U/L   ALT 15 14 - 54 U/L   Alkaline Phosphatase 61 38 - 126 U/L   Total Bilirubin 0.3 0.3 - 1.2 mg/dL   GFR calc non Af Amer >60 >60 mL/min   GFR calc Af Amer >60 >60 mL/min   Anion gap 8 5 - 15  CBC on admission   Collection Time: 11/27/17 12:00 PM  Result Value Ref Range   WBC 11.8 (H) 4.0 - 10.5 K/uL  RBC 4.61 3.87 - 5.11 MIL/uL   Hemoglobin 13.0 12.0 - 15.0 g/dL   HCT 16.1 09.6 - 04.5 %   MCV 84.2 78.0 - 100.0 fL   MCH 28.2 26.0 - 34.0 pg   MCHC 33.5 30.0 - 36.0 g/dL   RDW 40.9 (H) 81.1 - 91.4 %   Platelets 235 150 - 400 K/uL  Type and screen [facility]   Collection Time: 11/27/17 12:00 PM  Result Value Ref Range   ABO/RH(D) A POS    Antibody Screen NEG    Sample Expiration      11/30/2017 Performed at [facility], [address], [place], [place] [address]   ABO/Rh   Collection Time: 11/27/17 12:00 PM  Result Value Ref Range   ABO/RH(D)      A POS Performed at [facility], [address], [place], [place] [address]   Urinalysis, Routine w  reflex microscopic   Collection Time: 11/27/17  2:00 PM  Result Value Ref Range   Color, Urine YELLOW YELLOW   APPearance HAZY (A) CLEAR   Specific Gravity, Urine 1.012 1.005 - 1.030   pH 8.0 5.0 - 8.0   Glucose, UA NEGATIVE NEGATIVE mg/dL   Hgb urine dipstick NEGATIVE NEGATIVE   Bilirubin Urine NEGATIVE NEGATIVE   Ketones, ur NEGATIVE NEGATIVE mg/dL   Protein, ur NEGATIVE NEGATIVE mg/dL   Nitrite NEGATIVE NEGATIVE   Leukocytes, UA NEGATIVE NEGATIVE    Discharge Condition: Stable  Disposition: Discharge disposition: 01-Home or Self Care         Allergies as of 11/28/2017   No Known Allergies     Medication List    TAKE these medications   Iron 325 (65 Fe) MG Tabs Take 1 tablet by mouth daily.   PRENATAL VITAMINS PO Take 1 tablet by mouth daily.      Follow-up Information    Alm Bustard, MD Follow up in 3 day(s).   Specialty:  Specialist Contact information: [address] [place] [address] [phone]         MFM [place] in 56 days, discussed with Dr Otho Perl  Signed: Scheryl Darter M.D. 11/28/2017, 10:50 AM

## 2017-11-28 NOTE — Discharge Instructions (Signed)
Second Trimester of Pregnancy The second trimester is from week 13 through week 28, month 4 through 6. This is often the time in pregnancy that you feel your best. Often times, morning sickness has lessened or quit. You may have more energy, and you may get hungry more often. Your unborn baby (fetus) is growing rapidly. At the end of the sixth month, he or she is about 9 inches long and weighs about 1 pounds. You will likely feel the baby move (quickening) between 18 and 20 weeks of pregnancy. Follow these instructions at home:  Avoid all smoking, herbs, and alcohol. Avoid drugs not approved by your doctor.  Do not use any tobacco products, including cigarettes, chewing tobacco, and electronic cigarettes. If you need help quitting, ask your doctor. You may get counseling or other support to help you quit.  Only take medicine as told by your doctor. Some medicines are safe and some are not during pregnancy.  Exercise only as told by your doctor. Stop exercising if you start having cramps.  Eat regular, healthy meals.  Wear a good support bra if your breasts are tender.  Do not use hot tubs, steam rooms, or saunas.  Wear your seat belt when driving.  Avoid raw meat, uncooked cheese, and liter boxes and soil used by cats.  Take your prenatal vitamins.  Take 1500-2000 milligrams of calcium daily starting at the 20th week of pregnancy until you deliver your baby.  Try taking medicine that helps you poop (stool softener) as needed, and if your doctor approves. Eat more fiber by eating fresh fruit, vegetables, and whole grains. Drink enough fluids to keep your pee (urine) clear or pale yellow.  Take warm water baths (sitz baths) to soothe pain or discomfort caused by hemorrhoids. Use hemorrhoid cream if your doctor approves.  If you have puffy, bulging veins (varicose veins), wear support hose. Raise (elevate) your feet for 15 minutes, 3-4 times a day. Limit salt in your diet.  Avoid heavy  lifting, wear low heals, and sit up straight.  Rest with your legs raised if you have leg cramps or low back pain.  Visit your dentist if you have not gone during your pregnancy. Use a soft toothbrush to brush your teeth. Be gentle when you floss.  You can have sex (intercourse) unless your doctor tells you not to.  Go to your doctor visits. Get help if:  You feel dizzy.  You have mild cramps or pressure in your lower belly (abdomen).  You have a nagging pain in your belly area.  You continue to feel sick to your stomach (nauseous), throw up (vomit), or have watery poop (diarrhea).  You have bad smelling fluid coming from your vagina.  You have pain with peeing (urination). Get help right away if:  You have a fever.  You are leaking fluid from your vagina.  You have spotting or bleeding from your vagina.  You have severe belly cramping or pain.  You lose or gain weight rapidly.  You have trouble catching your breath and have chest pain.  You notice sudden or extreme puffiness (swelling) of your face, hands, ankles, feet, or legs.  You have not felt the baby move in over an hour.  You have severe headaches that do not go away with medicine.  You have vision changes. This information is not intended to replace advice given to you by your health care provider. Make sure you discuss any questions you have with your health care   provider. Document Released: 11/13/2009 Document Revised: 01/25/2016 Document Reviewed: 10/20/2012 Elsevier Interactive Patient Education  2017 [employer] Inc[employer]  

## 2017-11-29 LAB — CULTURE, BETA STREP (GROUP B ONLY)

## 2017-12-04 ENCOUNTER — Other Ambulatory Visit (HOSPITAL_COMMUNITY): Payer: Self-pay | Admitting: *Deleted

## 2017-12-04 DIAGNOSIS — O4100X0 Oligohydramnios, unspecified trimester, not applicable or unspecified: Secondary | ICD-10-CM

## 2017-12-05 ENCOUNTER — Encounter (HOSPITAL_COMMUNITY): Payer: Self-pay

## 2017-12-05 ENCOUNTER — Ambulatory Visit (HOSPITAL_COMMUNITY)
Admission: RE | Admit: 2017-12-05 | Discharge: 2017-12-05 | Disposition: A | Discharge status: Home / Self Care | Payer: Medicaid Other | Source: Ambulatory Visit | Attending: Specialist | Admitting: Specialist

## 2017-12-05 DIAGNOSIS — O99212 Obesity complicating pregnancy, second trimester: Secondary | ICD-10-CM | POA: Insufficient documentation

## 2017-12-05 DIAGNOSIS — O99352 Diseases of the nervous system complicating pregnancy, second trimester: Secondary | ICD-10-CM | POA: Diagnosis not present

## 2017-12-05 DIAGNOSIS — O0932 Supervision of pregnancy with insufficient antenatal care, second trimester: Secondary | ICD-10-CM | POA: Insufficient documentation

## 2017-12-05 DIAGNOSIS — Z3A25 25 weeks gestation of pregnancy: Secondary | ICD-10-CM | POA: Insufficient documentation

## 2017-12-05 DIAGNOSIS — G40909 Epilepsy, unspecified, not intractable, without status epilepticus: Secondary | ICD-10-CM | POA: Diagnosis not present

## 2017-12-05 DIAGNOSIS — O289 Unspecified abnormal findings on antenatal screening of mother: Secondary | ICD-10-CM | POA: Diagnosis present

## 2017-12-05 DIAGNOSIS — O4100X0 Oligohydramnios, unspecified trimester, not applicable or unspecified: Secondary | ICD-10-CM

## 2017-12-05 NOTE — Addendum Note (Signed)
Encounter addended by: Emeline DarlingKiser, Obadiah Dennard E on: 12/05/2017 12:27 PM  Actions taken: Imaging Exam ended

## 2017-12-09 ENCOUNTER — Other Ambulatory Visit (HOSPITAL_COMMUNITY): Payer: Self-pay

## 2018-06-12 ENCOUNTER — Encounter (HOSPITAL_COMMUNITY): Payer: Self-pay

## 2019-12-24 IMAGING — US US MFM OB FOLLOW-UP
1 series · 14 of 26 positions shown · non-contrast
Comparison: none

[Series 1: us mfm ob follow-up · 26 acquisitions, 14 frames shown]
[im 1/26]
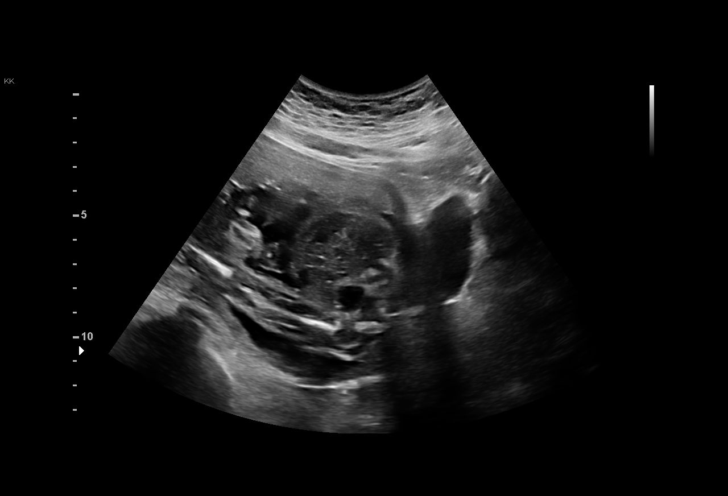
[im 3/26]
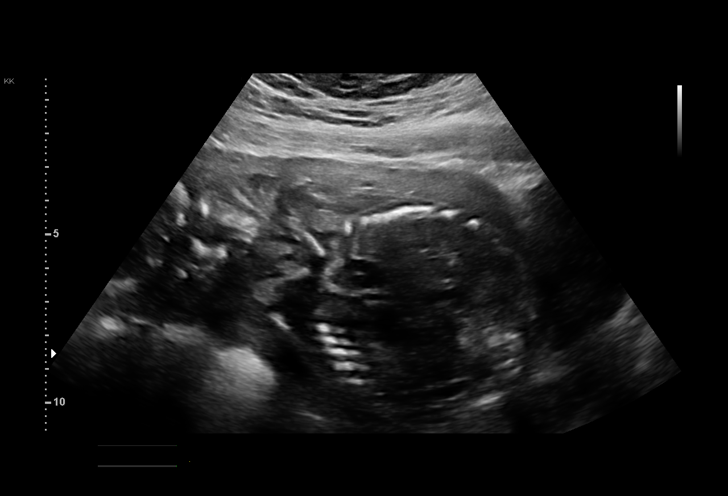
[im 5/26]
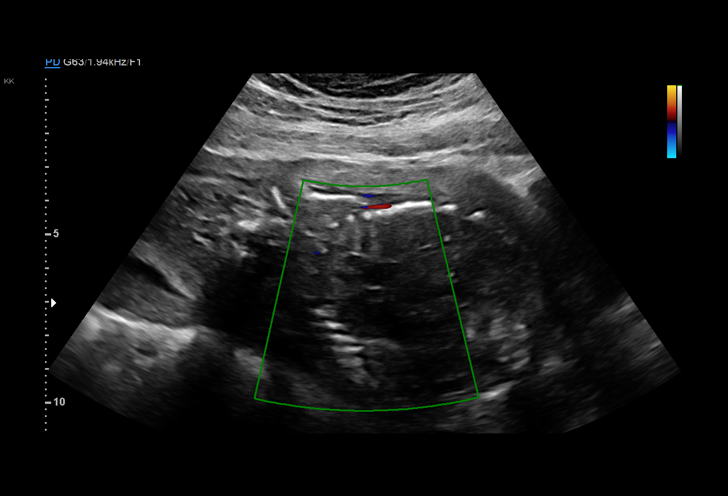
[im 7/26]
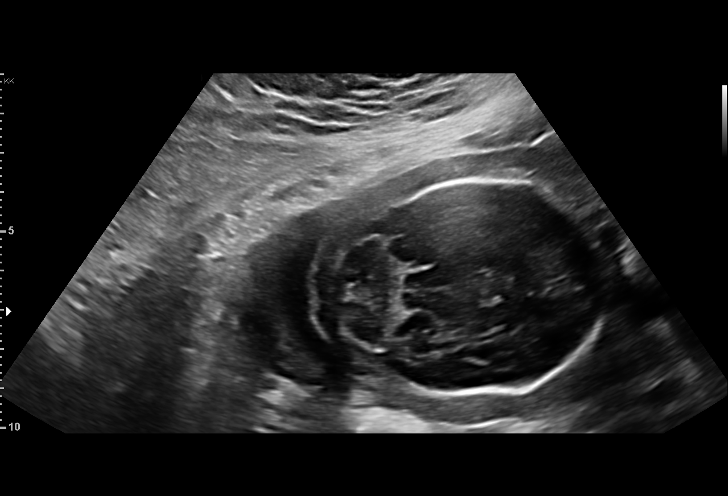
[im 9/26]
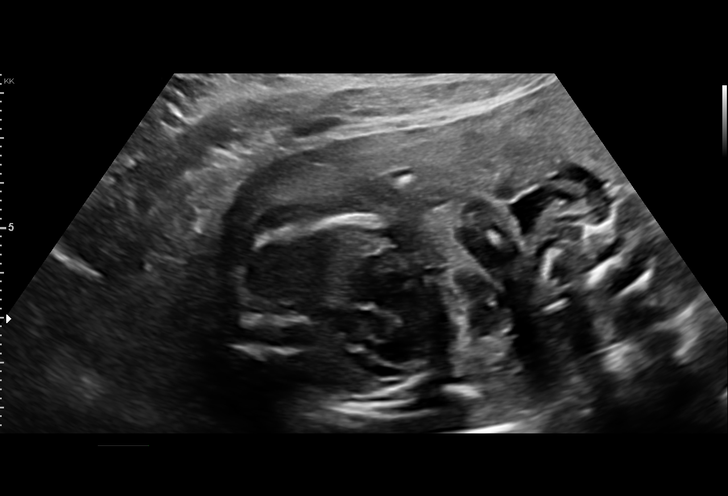
[im 11/26]
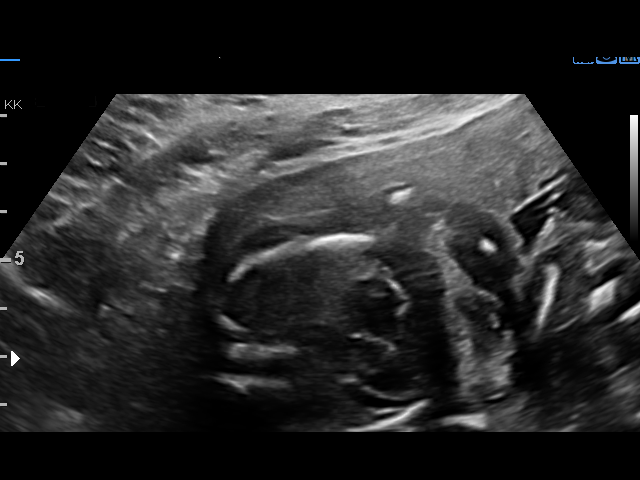
[im 13/26]
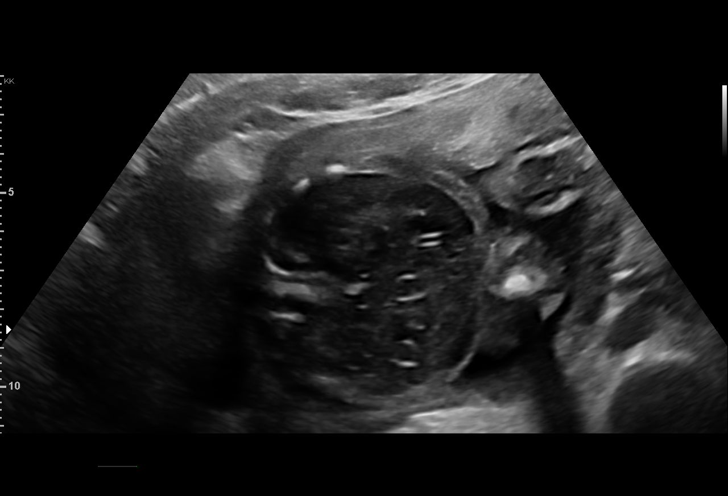
[im 14/26]
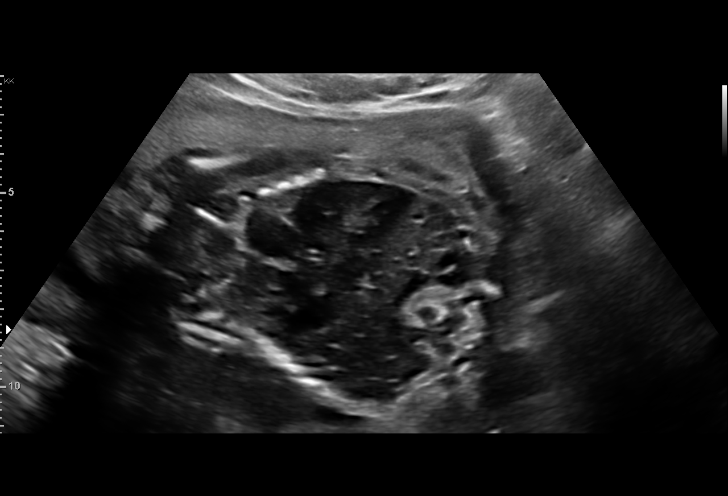
[im 16/26]
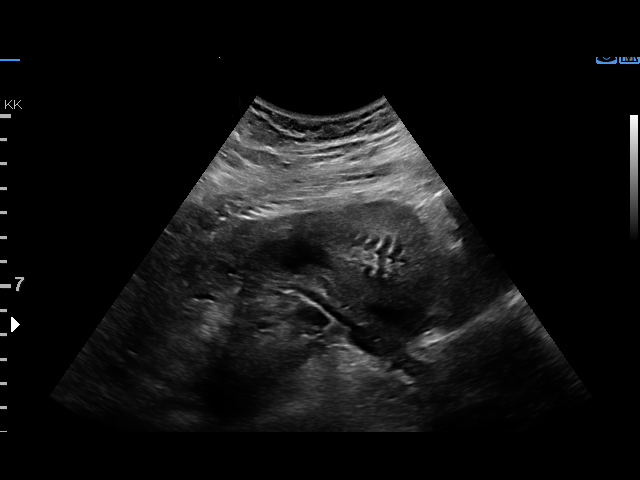
[im 18/26]
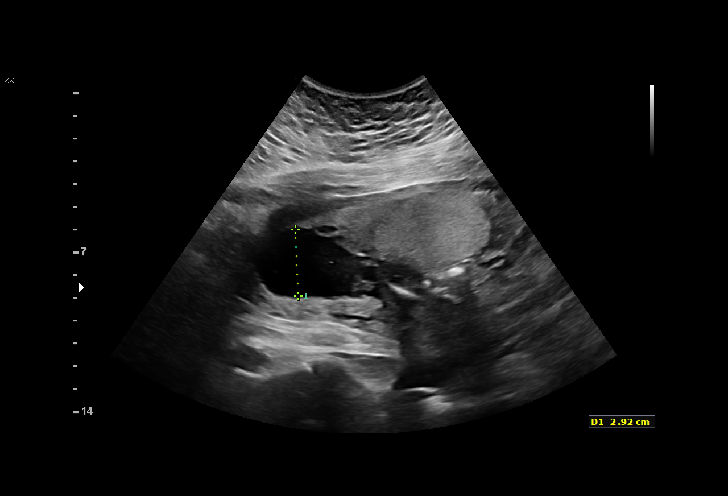
[im 20/26]
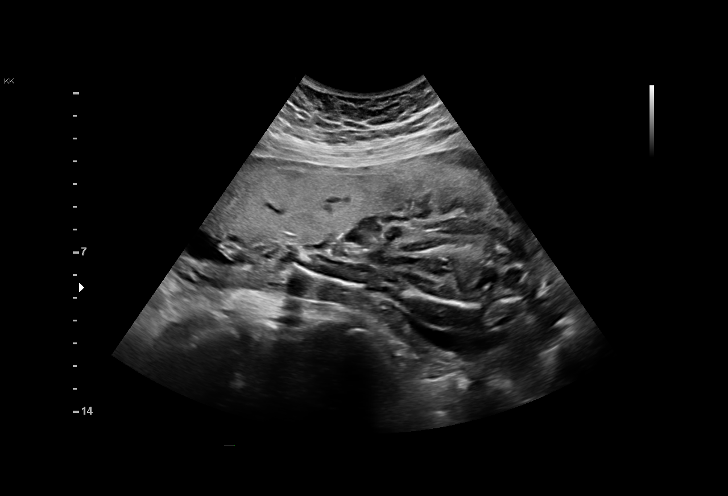
[im 22/26]
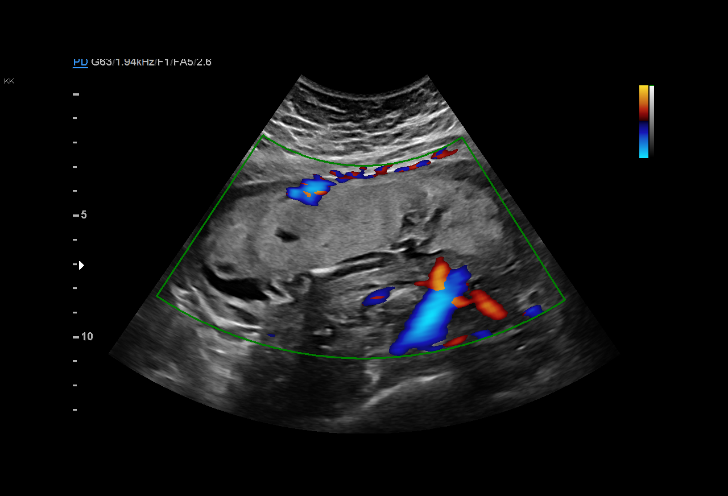
[im 24/26]
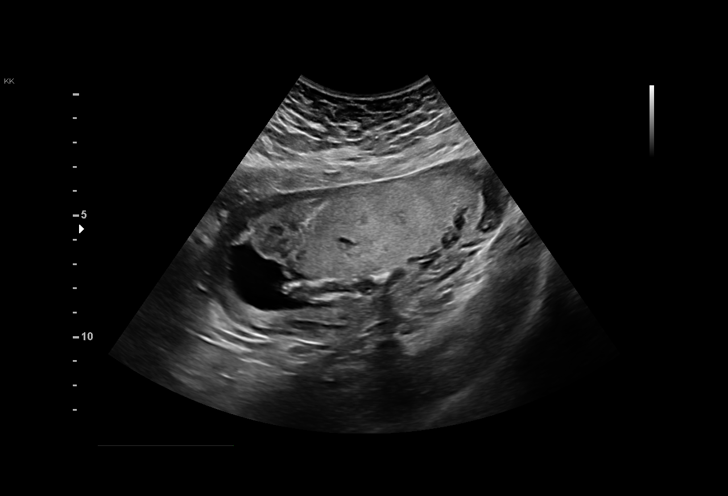
[im 26/26]
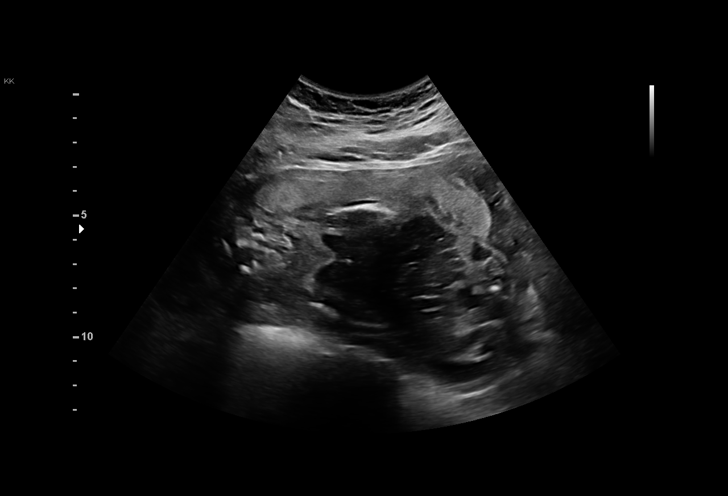

[14 of 26 positions shown; findings below may reference images not displayed]

[HOSPITAL][HOSPITAL]

1  [facility]           [id number]      [id number]     [id number]
Indications

25 weeks gestation of pregnancy
Encounter for uncertain dates
Abnormal biochemical screen (Abnormal
quad, OSB [DATE])
Late prenatal care, second trimester
Obesity complicating pregnancy, second
trimester
Seizure disorder
OB History

Blood Type:            Height:  5'6"   Weight (lb):  220       BMI:
Gravidity:    1         Term:   0        Prem:   0        SAB:   0
TOP:          0       Ectopic:  0        Living: 0
Fetal Evaluation

Num Of Fetuses:     1
Cardiac Activity:   Absent
Presentation:       Frank breech

Amniotic Fluid
AFI FV:      Oligohydramnios

Largest Pocket(cm)
2.9
Gestational Age

LMP:           25w 6d        Date:  06/07/17                 EDD:   03/14/18
Best:          25w 0d     Det. By:  Early Ultrasound         EDD:   03/20/18
(11/03/17)
Anatomy

Cranium:               Previously seen        Aortic Arch:            Not well visualized
Cavum:                 Appears normal         Ductal Arch:            Not well visualized
Ventricles:            Previously seen        Diaphragm:              Previously seen
Choroid Plexus:        Previously seen        Stomach:                Previously Seen
Cerebellum:            Previously seen        Abdomen:                Previously seen
Posterior Fossa:       Not well visualized    Abdominal Wall:         Not well visualized
Nuchal Fold:           Not applicable (>20    Cord Vessels:           Not well visualized
wks GA)
Face:                  Not well visualized    Kidneys:                Not well visualized
Lips:                  Previously seen        Bladder:                Appears normal
Thoracic:              Appears normal         Spine:                  Not well visualized
Heart:                 Not well visualized    Upper Extremities:      Not well visualized
RVOT:                  Previously seen        Lower Extremities:      Previously seen
LVOT:                  Previously seen

Other:  Male gender. Technically difficult due to low amniotic fluid.
Cervix Uterus Adnexa

Cervix
Not visualized
Impression

Singleton intrauterine pregnancy at 25+0 weeks with
oligohydranios and elevated inhibin/MSAFP here for
evaluation
No fetal cardiac activity is noted
Amniotic fluid volume is significantly decreased
Recommendations

Discussed findings with the patient at length
Informed Dr. [REDACTED]. He will contact her about
management as soon as he is out of the operating room

## 2023-07-13 ENCOUNTER — Emergency Department (HOSPITAL_COMMUNITY): Payer: No Typology Code available for payment source

## 2023-07-13 ENCOUNTER — Encounter (HOSPITAL_COMMUNITY): Payer: Self-pay | Admitting: *Deleted

## 2023-07-13 ENCOUNTER — Other Ambulatory Visit: Payer: Self-pay

## 2023-07-13 ENCOUNTER — Emergency Department (HOSPITAL_COMMUNITY)
Admission: EM | Admit: 2023-07-13 | Discharge: 2023-07-14 | Disposition: A | Discharge status: Home / Self Care | Payer: No Typology Code available for payment source | Attending: Emergency Medicine | Admitting: Emergency Medicine

## 2023-07-13 DIAGNOSIS — S59902A Unspecified injury of left elbow, initial encounter: Secondary | ICD-10-CM | POA: Diagnosis present

## 2023-07-13 DIAGNOSIS — S53025A Posterior dislocation of left radial head, initial encounter: Secondary | ICD-10-CM | POA: Insufficient documentation

## 2023-07-13 DIAGNOSIS — S53125A Posterior dislocation of left ulnohumeral joint, initial encounter: Secondary | ICD-10-CM

## 2023-07-13 DIAGNOSIS — Y92410 Unspecified street and highway as the place of occurrence of the external cause: Secondary | ICD-10-CM | POA: Insufficient documentation

## 2023-07-13 DIAGNOSIS — R109 Unspecified abdominal pain: Secondary | ICD-10-CM | POA: Diagnosis not present

## 2023-07-13 LAB — I-STAT CHEM 8, ED
BUN: 13 mg/dL (ref 6–20)
Calcium, Ion: 1.28 mmol/L (ref 1.15–1.40)
Chloride: 101 mmol/L (ref 98–111)
Creatinine, Ser: 0.6 mg/dL (ref 0.44–1.00)
Glucose, Bld: 130 mg/dL — ABNORMAL HIGH (ref 70–99)
HCT: 34 % — ABNORMAL LOW (ref 36.0–46.0)
Hemoglobin: 11.6 g/dL — ABNORMAL LOW (ref 12.0–15.0)
Potassium: 4.1 mmol/L (ref 3.5–5.1)
Sodium: 138 mmol/L (ref 135–145)
TCO2: 25 mmol/L (ref 22–32)

## 2023-07-13 LAB — HCG, SERUM, QUALITATIVE: Preg, Serum: NEGATIVE

## 2023-07-13 MED ORDER — PROPOFOL 10 MG/ML IV BOLUS
0.5000 mg/kg | Freq: Once | INTRAVENOUS | Status: AC
  Administered 2023-07-14: 48.8 mg via INTRAVENOUS
  Filled 2023-07-13: qty 20

## 2023-07-13 MED ORDER — KETAMINE HCL 50 MG/5ML IJ SOSY
1.0000 mg/kg | PREFILLED_SYRINGE | Freq: Once | INTRAMUSCULAR | Status: AC
  Administered 2023-07-14: 50 mg via INTRAVENOUS
  Filled 2023-07-13: qty 10
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
## 2023-07-13 NOTE — ED Notes (Signed)
PT to radiology

## 2023-07-14 ENCOUNTER — Emergency Department (HOSPITAL_COMMUNITY): Payer: No Typology Code available for payment source

## 2023-07-14 DIAGNOSIS — S53025A Posterior dislocation of left radial head, initial encounter: Secondary | ICD-10-CM | POA: Diagnosis not present

## 2023-07-14 MED ORDER — PROPOFOL 10 MG/ML IV BOLUS
INTRAVENOUS | Status: AC | PRN
  Administered 2023-07-14: 48.75 mg via INTRAVENOUS

## 2023-07-14 MED ORDER — IOHEXOL 350 MG/ML SOLN
75.0000 mL | Freq: Once | INTRAVENOUS | Status: AC | PRN
  Administered 2023-07-14: 75 mL via INTRAVENOUS

## 2023-07-14 MED ORDER — NAPROXEN 500 MG PO TABS: 500.0000 mg | ORAL_TABLET | Freq: Two times a day (BID) | ORAL | 0 refills | Status: AC | PRN

## 2023-07-14 MED ORDER — KETAMINE HCL 10 MG/ML IJ SOLN
INTRAMUSCULAR | Status: AC | PRN
  Administered 2023-07-14: 48.75 mg via INTRAVENOUS

## 2023-07-14 NOTE — ED Notes (Signed)
Pt to ct 

## 2023-07-14 NOTE — ED Notes (Signed)
Pt ambulated to bathroom 

## 2023-07-14 NOTE — Discharge Instructions (Signed)
Range your elbow slowly and follow-up with the orthopedic doctor.  Return to the ED with new or worsening symptoms.
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
## 2023-07-14 NOTE — ED Notes (Signed)
Pt ret from ct
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
